# Patient Record
Sex: Male | Born: 2005 | Race: White | Hispanic: No | Marital: Single | Smoking: Never smoker
Health system: Southern US, Community
[De-identification: ages and names within clinical notes are randomized; demographics above are authoritative.]

## PROBLEM LIST (undated history)

## (undated) HISTORY — PX: TONSILLECTOMY: SUR1361

---
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
## 2023-03-20 ENCOUNTER — Ambulatory Visit
Admission: EM | Admit: 2023-03-20 | Discharge: 2023-03-20 | Disposition: A | Discharge status: Home / Self Care | Payer: Medicaid Other | Attending: Family Medicine | Admitting: Family Medicine

## 2023-03-20 ENCOUNTER — Encounter: Payer: Self-pay | Admitting: Emergency Medicine

## 2023-03-20 DIAGNOSIS — J069 Acute upper respiratory infection, unspecified: Secondary | ICD-10-CM | POA: Diagnosis not present

## 2023-03-20 LAB — POC COVID19/FLU A&B COMBO
Covid Antigen, POC: NEGATIVE
Influenza A Antigen, POC: NEGATIVE
Influenza B Antigen, POC: NEGATIVE

## 2023-03-20 LAB — POCT RAPID STREP A (OFFICE): Rapid Strep A Screen: NEGATIVE

## 2023-03-20 NOTE — ED Provider Notes (Signed)
 [facility]    CSN: [id number] Arrival date & time: 03/20/23  1221      History   Chief Complaint No chief complaint on file.   HPI Ernest Spence is a 18 y.o. male.   Presenting today with 2-day history of sore throat, cough, congestion.  Denies fever, chills, chest pain, shortness of breath, abdominal pain, nausea vomiting or diarrhea.  So far trying over-the-counter cold and congestion medications with mild relief.  No known sick contacts recently.    History reviewed. No pertinent past medical history.  Patient Active Problem List   Diagnosis Date Noted   Left-sided nosebleed 03/31/2017    Past Surgical History:  Procedure Laterality Date   TONSILLECTOMY         Home Medications    Prior to Admission medications   Medication Sig Start Date End Date Taking? Authorizing Provider  ibuprofen (ADVIL,MOTRIN) 200 MG tablet Take 200 mg by mouth every 4 (four) hours as needed for fever, mild pain or moderate pain.    [provider]    Family History History reviewed. No pertinent family history.  Social History Social History   Tobacco Use   Smoking status: Never  Vaping Use   Vaping status: Never Used     Allergies   Amoxicillin   Review of Systems Review of Systems Per HPI  Physical Exam Triage Vital Signs ED Triage Vitals [03/20/23 1229]  Encounter Vitals Group     BP 126/69     Systolic BP Percentile      Diastolic BP Percentile      Pulse Rate 63     Resp 18     Temp 97.7 F (36.5 C)     Temp Source Oral     SpO2 98 %     Weight      Height      Head Circumference      Peak Flow      Pain Score 6     Pain Loc      Pain Education      Exclude from Growth Chart    No data found.  Updated Vital Signs BP 126/69 (BP Location: Right Arm)   Pulse 63   Temp 97.7 F (36.5 C) (Oral)   Resp 18   SpO2 98%   Visual Acuity Right Eye Distance:   Left Eye Distance:   Bilateral Distance:    Right Eye Near:    Left Eye Near:    Bilateral Near:     Physical Exam Vitals and nursing note reviewed.  Constitutional:      Appearance: He is well-developed.  HENT:     Head: Atraumatic.     Right Ear: External ear normal.     Left Ear: External ear normal.     Nose: Rhinorrhea present.     Mouth/Throat:     Pharynx: Posterior oropharyngeal erythema present. No oropharyngeal exudate.  Eyes:     Conjunctiva/sclera: Conjunctivae normal.     Pupils: Pupils are equal, round, and reactive to light.  Cardiovascular:     Rate and Rhythm: Normal rate and regular rhythm.  Pulmonary:     Effort: Pulmonary effort is normal. No respiratory distress.     Breath sounds: No wheezing or rales.  Musculoskeletal:        General: Normal range of motion.     Cervical back: Normal range of motion and neck supple.  Lymphadenopathy:     Cervical: No cervical adenopathy.  Skin:    General: Skin is warm and dry.  Neurological:     Mental Status: He is alert and oriented to person, place, and time.  Psychiatric:        Behavior: Behavior normal.      UC Treatments / Results  Labs (all labs ordered are listed, but only abnormal results are displayed) Labs Reviewed  POCT RAPID STREP A (OFFICE)  POC COVID19/FLU A&B COMBO    EKG   Radiology No results found.  Procedures Procedures (including critical care time)  Medications Ordered in UC Medications - No data to display  Initial Impression / Assessment and Plan / UC Course  I have reviewed the triage vital signs and the nursing notes.  Pertinent labs & imaging results that were available during my care of the patient were reviewed by me and considered in my medical decision making (see chart for details).     Rapid strep, rapid COVID and flu all negative.  Suspect viral respiratory infection.  Treat with supportive over-the-counter medications, home care.  Return precautions reviewed.  Final Clinical Impressions(s) / UC Diagnoses   Final  diagnoses:  Viral URI with cough     Discharge Instructions      Rapid strep, rapid flu, rapid COVID all negative.  Continue over-the-counter cold congestion medications, pain and fever reducers if needed.  Follow-up for significantly worsening symptoms    ED Prescriptions   None    PDMP not reviewed this encounter.   Particia Nearing, [place] 03/20/23 1426

## 2023-03-20 NOTE — Discharge Instructions (Signed)
 Rapid strep, rapid flu, rapid COVID all negative.  Continue over-the-counter cold congestion medications, pain and fever reducers if needed.  Follow-up for significantly worsening symptoms

## 2023-03-20 NOTE — ED Triage Notes (Signed)
 Sore throat, cough, since Wednesday.

## 2024-02-18 ENCOUNTER — Ambulatory Visit (INDEPENDENT_AMBULATORY_CARE_PROVIDER_SITE_OTHER)

## 2024-02-18 ENCOUNTER — Other Ambulatory Visit: Payer: Self-pay

## 2024-02-18 ENCOUNTER — Ambulatory Visit
Admission: RE | Admit: 2024-02-18 | Discharge: 2024-02-18 | Disposition: A | Discharge status: Home / Self Care | Source: Ambulatory Visit | Attending: Nurse Practitioner

## 2024-02-18 VITALS — BP 144/79 | HR 99 | Temp 98.2°F | Resp 18

## 2024-02-18 DIAGNOSIS — R0789 Other chest pain: Secondary | ICD-10-CM

## 2024-02-18 MED ORDER — FAMOTIDINE 40 MG PO TABS
40.0000 mg | ORAL_TABLET | Freq: Once | ORAL | Status: AC
  Administered 2024-02-18: 40 mg via ORAL

## 2024-02-18 MED ORDER — LIDOCAINE VISCOUS HCL 2 % MT SOLN
15.0000 mL | Freq: Once | OROMUCOSAL | Status: AC
  Administered 2024-02-18: 15 mL via OROMUCOSAL

## 2024-02-18 MED ORDER — ALUM & MAG HYDROXIDE-SIMETH 200-200-20 MG/5ML PO SUSP
30.0000 mL | Freq: Once | ORAL | Status: AC
  Administered 2024-02-18: 30 mL via ORAL

## 2024-02-18 MED ORDER — PANTOPRAZOLE SODIUM 40 MG PO TBEC: 40.0000 mg | DELAYED_RELEASE_TABLET | Freq: Every day | ORAL | 0 refills | Status: AC

## 2024-02-18 MED ORDER — FAMOTIDINE 40 MG PO TABS: 40.0000 mg | ORAL_TABLET | Freq: Once | ORAL | Status: DC

## 2024-02-18 NOTE — ED Provider Notes (Signed)
 " [facility]    CSN: [id number] Arrival date & time: 02/18/24  1750      History   Chief Complaint Chief Complaint  Patient presents with   Chest Pain    HPI Ernest Spence is a 19 y.o. male.   The history is provided by the patient.   Patient presents for complaints of midsternal chest pain and fatigue that has been present for the past several days.  Patient states symptoms started when he was eating at a local [employer].  He states the pain worsens when he eats.  He denies nausea, vomiting, gas, bloating, fever, abdominal pain, constipation, fever, or chills.  States that he did try over-the-counter medication, but only felt relief when he had Tums, but states that the relief was temporary.  Patient denies asthma or history of vaping.  History reviewed. No pertinent past medical history.  Patient Active Problem List   Diagnosis Date Noted   Left-sided nosebleed 03/31/2017    Past Surgical History:  Procedure Laterality Date   TONSILLECTOMY         Home Medications    Prior to Admission medications  Medication Sig Start Date End Date Taking? Authorizing Provider  ibuprofen (ADVIL,MOTRIN) 200 MG tablet Take 200 mg by mouth every 4 (four) hours as needed for fever, mild pain or moderate pain.    [provider]    Family History History reviewed. No pertinent family history.  Social History Social History[1]   Allergies   Amoxicillin   Review of Systems Review of Systems Per HPI  Physical Exam Triage Vital Signs ED Triage Vitals  Encounter Vitals Group     BP 02/18/24 1758 (!) 144/79     Girls Systolic BP Percentile --      Girls Diastolic BP Percentile --      Boys Systolic BP Percentile --      Boys Diastolic BP Percentile --      Pulse Rate 02/18/24 1758 99     Resp 02/18/24 1758 18     Temp 02/18/24 1758 98.2 F (36.8 C)     Temp Source 02/18/24 1758 Oral     SpO2 02/18/24 1758 94 %     Weight --      Height  --      Head Circumference --      Peak Flow --      Pain Score 02/18/24 1759 6     Pain Loc --      Pain Education --      Exclude from Growth Chart --    No data found.  Updated Vital Signs BP (!) 144/79   Pulse 99   Temp 98.2 F (36.8 C) (Oral)   Resp 18   SpO2 94%   Visual Acuity Right Eye Distance:   Left Eye Distance:   Bilateral Distance:    Right Eye Near:   Left Eye Near:    Bilateral Near:     Physical Exam Vitals and nursing note reviewed.  Constitutional:      General: He is not in acute distress.    Appearance: He is well-developed.  HENT:     Head: Normocephalic.  Eyes:     Extraocular Movements: Extraocular movements intact.     Pupils: Pupils are equal, round, and reactive to light.  Cardiovascular:     Rate and Rhythm: Regular rhythm.     Pulses: Normal pulses.     Heart sounds: Normal heart sounds.  Pulmonary:     Effort: Pulmonary effort is normal. No respiratory distress.     Breath sounds: Normal breath sounds. No stridor. No wheezing, rhonchi or rales.  Chest:     Chest wall: Tenderness present.    Abdominal:     General: Bowel sounds are normal.     Palpations: Abdomen is soft.     Tenderness: There is no abdominal tenderness.  Musculoskeletal:     Cervical back: Normal range of motion.  Skin:    General: Skin is warm and dry.  Neurological:     General: No focal deficit present.     Mental Status: He is alert and oriented to person, place, and time.  Psychiatric:        Mood and Affect: Mood normal.        Behavior: Behavior normal.      UC Treatments / Results  Labs (all labs ordered are listed, but only abnormal results are displayed) Labs Reviewed - No data to display  EKG: Sinus tachycardia, no ectopy, no STEMI.  No other comparisons available.   Radiology No results found.  Procedures Procedures (including critical care time)  Medications Ordered in UC Medications  alum & mag hydroxide-simeth  (MAALOX/MYLANTA) 200-200-20 MG/5ML suspension 30 mL (30 mLs Oral Given 02/18/24 1819)  lidocaine  (XYLOCAINE ) 2 % viscous mouth solution 15 mL (15 mLs Mouth/Throat Given 02/18/24 1819)  famotidine  (PEPCID ) tablet 40 mg (40 mg Oral Given 02/18/24 1819)    Initial Impression / Assessment and Plan / UC Course  I have reviewed the triage vital signs and the nursing notes.  Pertinent labs & imaging results that were available during my care of the patient were reviewed by me and considered in my medical decision making (see chart for details).  Patient presents for complaints of midsternal chest pain that is been present for the past several days.  On exam, the patient is well-appearing, he is in no acute distress, he is mildly hypertensive, but vital signs are otherwise stable.  Symptoms started after eating [place].  On exam, lung sounds are clear throughout, room air sats are at 94%.  Patient does have pain that is reproducible with palpation in the midsternal area.  He reports no improvement of his symptoms after administration of a GI cocktail and famotidine  40 mg.  EKG shows sinus tachycardia, no ectopy, no STEMI.  Chest x-ray was negative for active cardiopulmonary disease.  Will treat patient with Protonix  40 mg and have him follow-up with his primary care physician for further evaluation.  Supportive care recommendations were provided and discussed with the patient to include dietary modifications, fluids, and rest.  Patient was also given strict ER follow-up precautions if symptoms worsen.  Patient was in agreement with this plan of care and verbalizes understanding.  All questions were answered.  Patient stable for discharge  Final Clinical Impressions(s) / UC Diagnoses   Final diagnoses:  None   Discharge Instructions   None    ED Prescriptions   None    PDMP not reviewed this encounter.     [1]  Social History Tobacco Use   Smoking status: Never  Vaping Use   Vaping status:  Never Used     Gilmer Etta PARAS, NP 02/18/24 1923  "

## 2024-02-18 NOTE — ED Triage Notes (Signed)
 Pt reports he has mid area chest pain and no energy x 5 days. States it hurts to drink and eat  Started having a cough today

## 2024-02-18 NOTE — Discharge Instructions (Signed)
 The chest x-ray was negative.  Your EKG was also normal.  Your symptoms are consistent with reflux. Take medication as prescribed. You may take over-the-counter Tylenol or ibuprofen as needed for pain, fever, or general discomfort. Recommend dietary modifications to prevent worsening symptoms.  Avoid spicy foods, tomato-based foods, dairy, mint based foods, caffeine, or red meat. Recommend eating 6 small meals per day instead of 3 large meals. Make sure you are eating at least 2 to 3 hours before bedtime. Go to the emergency department if you have worsening chest pain, shortness of breath, difficulty breathing, nausea, vomiting, or other concerns. Follow-up with your primary care physician if symptoms fail to improve. Follow-up as needed.
